# Patient Record
Sex: Male | Born: 1955 | Race: White | Hispanic: No
Health system: Southern US, Community
[De-identification: ages and names within clinical notes are randomized; demographics above are authoritative.]

## PROBLEM LIST (undated history)

## (undated) DIAGNOSIS — I219 Acute myocardial infarction, unspecified: Secondary | ICD-10-CM

## (undated) DIAGNOSIS — C801 Malignant (primary) neoplasm, unspecified: Secondary | ICD-10-CM

## (undated) HISTORY — PX: PELVIC FLOOR REPAIR: SHX2192

---

## 2014-10-16 HISTORY — PX: CARDIAC CATHETERIZATION: SHX172

## 2014-12-15 DIAGNOSIS — I219 Acute myocardial infarction, unspecified: Secondary | ICD-10-CM

## 2014-12-15 HISTORY — DX: Acute myocardial infarction, unspecified: I21.9

## 2018-06-05 ENCOUNTER — Emergency Department (HOSPITAL_COMMUNITY)
Admission: EM | Admit: 2018-06-05 | Discharge: 2018-06-05 | Disposition: A | Discharge status: Home / Self Care | Payer: BC Managed Care – PPO | Attending: Emergency Medicine | Admitting: Emergency Medicine

## 2018-06-05 ENCOUNTER — Other Ambulatory Visit: Payer: Self-pay

## 2018-06-05 ENCOUNTER — Emergency Department (HOSPITAL_COMMUNITY): Payer: BC Managed Care – PPO

## 2018-06-05 ENCOUNTER — Encounter (HOSPITAL_COMMUNITY): Payer: Self-pay | Admitting: Emergency Medicine

## 2018-06-05 DIAGNOSIS — X58XXXA Exposure to other specified factors, initial encounter: Secondary | ICD-10-CM | POA: Diagnosis not present

## 2018-06-05 DIAGNOSIS — Z7982 Long term (current) use of aspirin: Secondary | ICD-10-CM | POA: Insufficient documentation

## 2018-06-05 DIAGNOSIS — Y939 Activity, unspecified: Secondary | ICD-10-CM | POA: Diagnosis not present

## 2018-06-05 DIAGNOSIS — Y929 Unspecified place or not applicable: Secondary | ICD-10-CM | POA: Diagnosis not present

## 2018-06-05 DIAGNOSIS — S46812A Strain of other muscles, fascia and tendons at shoulder and upper arm level, left arm, initial encounter: Secondary | ICD-10-CM | POA: Insufficient documentation

## 2018-06-05 DIAGNOSIS — Z79899 Other long term (current) drug therapy: Secondary | ICD-10-CM | POA: Insufficient documentation

## 2018-06-05 DIAGNOSIS — Y999 Unspecified external cause status: Secondary | ICD-10-CM | POA: Diagnosis not present

## 2018-06-05 HISTORY — DX: Malignant (primary) neoplasm, unspecified: C80.1

## 2018-06-05 HISTORY — DX: Acute myocardial infarction, unspecified: I21.9

## 2018-06-05 LAB — I-STAT TROPONIN, ED
Troponin i, poc: 0 ng/mL (ref 0.00–0.08)
Troponin i, poc: 0 ng/mL (ref 0.00–0.08)

## 2018-06-05 LAB — CBC WITH DIFFERENTIAL/PLATELET
Abs Immature Granulocytes: 0.1 10*3/uL (ref 0.0–0.1)
Basophils Absolute: 0 10*3/uL (ref 0.0–0.1)
Basophils Relative: 1 %
Eosinophils Absolute: 0 10*3/uL (ref 0.0–0.7)
Eosinophils Relative: 0 %
HCT: 49.4 % (ref 39.0–52.0)
Hemoglobin: 16.4 g/dL (ref 13.0–17.0)
Immature Granulocytes: 1 %
Lymphocytes Relative: 6 %
Lymphs Abs: 0.4 10*3/uL — ABNORMAL LOW (ref 0.7–4.0)
MCH: 31.1 pg (ref 26.0–34.0)
MCHC: 33.2 g/dL (ref 30.0–36.0)
MCV: 93.6 fL (ref 78.0–100.0)
Monocytes Absolute: 0.4 10*3/uL (ref 0.1–1.0)
Monocytes Relative: 5 %
Neutro Abs: 6.1 10*3/uL (ref 1.7–7.7)
Neutrophils Relative %: 87 %
Platelets: 193 10*3/uL (ref 150–400)
RBC: 5.28 MIL/uL (ref 4.22–5.81)
RDW: 12.6 % (ref 11.5–15.5)
WBC: 7.1 10*3/uL (ref 4.0–10.5)

## 2018-06-05 LAB — COMPREHENSIVE METABOLIC PANEL
ALT: 33 U/L (ref 0–44)
AST: 21 U/L (ref 15–41)
Albumin: 4 g/dL (ref 3.5–5.0)
Alkaline Phosphatase: 76 U/L (ref 38–126)
Anion gap: 11 (ref 5–15)
BUN: 12 mg/dL (ref 6–20)
CO2: 25 mmol/L (ref 22–32)
Calcium: 9.7 mg/dL (ref 8.9–10.3)
Chloride: 102 mmol/L (ref 98–111)
Creatinine, Ser: 0.98 mg/dL (ref 0.61–1.24)
GFR calc Af Amer: >60 mL/min (ref >60)
GFR calc non Af Amer: >60 mL/min (ref >60)
Glucose, Bld: 114 mg/dL — ABNORMAL HIGH (ref 70–99)
Potassium: 4 mmol/L (ref 3.5–5.1)
Sodium: 138 mmol/L (ref 135–145)
Total Bilirubin: 1.1 mg/dL (ref 0.3–1.2)
Total Protein: 6.5 g/dL (ref 6.5–8.1)

## 2018-06-05 LAB — D-DIMER, QUANTITATIVE (NOT AT ARMC)

## 2018-06-05 LAB — BRAIN NATRIURETIC PEPTIDE: B NATRIURETIC PEPTIDE 5: 26 pg/mL (ref 0.0–100.0)

## 2018-06-05 MED ORDER — GI COCKTAIL ~~LOC~~
30.0000 mL | Freq: Once | ORAL | Status: AC
  Administered 2018-06-05: 30 mL via ORAL
  Filled 2018-06-05: qty 30

## 2018-06-05 NOTE — ED Triage Notes (Signed)
Patient to ED c/o intermittent L shoulderblade pain x 9 days, nothing makes it worse or better. He reports a couple episodes of palpitations and sweating since. He has a TENS unit and initially thought it was related to that. Hx MI March 2016, had cardiac cath and 3 stents placed. Hx prostate cancer as well, in remission since 2016. He reports he's also had muscle inflammation and his doctor put him on prednisone (currently taking). Denies SOB or dizziness. Took 1 SL NTG earlier today without relief. Resp e/u, skin warm/dry.

## 2018-06-05 NOTE — ED Provider Notes (Signed)
Patient placed in Quick Look pathway, seen and evaluated   Chief Complaint:   HPI:   Presents with tightness in the left upper back that started last week. Tried nitro which did not help. Used tens unit. Reports associated weakness, and fatigue, leg and abdominal swelling.  Today left sided chest pain. Hx of MI in 2016.   ROS: left shoulder blade pain, fatigue, abdominal fullness, lower leg swelling  Physical Exam:   Gen: No distress  Neuro: Awake and Alert  Skin: Warm    Focused Exam: regular hr and rhythm. Lungs clear to auscultation.   Initiation of care has begun. The patient has been counseled on the process, plan, and necessity for staying for the completion/evaluation, and the remainder of the medical screening examination  Pt with pain to the left shoulder blade. Most likely musculoskeletal given patient's prior cardiac history, will check labs and chest x-ray.  Patient otherwise in no acute distress.    Jeannett Senior, PA-C 06/05/18 2113    Deno Etienne, DO 06/05/18 2322

## 2018-06-05 NOTE — Discharge Instructions (Addendum)
Follow up with your PCP.  Return for worsening symptoms.  °

## 2018-06-05 NOTE — ED Provider Notes (Signed)
[facility] EMERGENCY DEPARTMENT Provider Note   CSN: [id number] Arrival date & time: 06/05/18  1521     History   Chief Complaint Chief Complaint  Patient presents with  . Shoulderblade Pain    HPI Jared Simmons is a 62 y.o. male.  62 yo M with a cc of left shoulder pain.  This been going on for the past week and a half.  He denies injury.  Worse with movement and palpation and improves with a TENS unit.  Seems to come and go.  Denies shortness of breath.  Patient has a history of a MI in the past that presented mostly with back pain and arm pain.  He is concerned that this may be the same.  Denies trauma denies fever denies hemoptysis denies unilateral lower extremity edema.  Patient has a history of prostate cancer and is on hormone therapy.  The history is provided by the patient.    Past Medical History:  Diagnosis Date  . Cancer [facility]) 2015-2016   prostate cancer - chemo  . Heart attack ([place]) 12/2014    There are no active problems to display for this patient.   Past Surgical History:  Procedure Laterality Date  . CARDIAC CATHETERIZATION  2016  . PELVIC FLOOR REPAIR          Home Medications    Prior to Admission medications   Medication Sig Start Date End Date Taking? Authorizing Provider  aspirin EC 81 MG tablet Take 81 mg by mouth daily.   Yes [provider]  cyanocobalamin (,VITAMIN B-12,) 1000 MCG/ML injection Inject 1,000 mcg into the muscle every 7 (seven) days. 05/07/18  Yes [provider]  cyclobenzaprine (FLEXERIL) 10 MG tablet Take 10 mg by mouth 3 (three) times daily as needed for muscle spasms.   Yes [provider]  hyoscyamine (LEVSIN SL) 0.125 MG SL tablet Place 0.125 mg under the tongue every 4 (four) hours as needed for cramping.   Yes [provider]  omeprazole (PRILOSEC) 20 MG capsule Take 20 mg by mouth daily.   Yes [provider]  oxyCODONE-acetaminophen  (PERCOCET/ROXICET) 5-325 MG tablet Take 1 tablet by mouth at bedtime as needed for severe pain.   Yes [provider]  predniSONE (DELTASONE) 10 MG tablet Take 10 mg by mouth 2 (two) times daily. 05/08/18  Yes [provider]  tamsulosin (FLOMAX) 0.4 MG CAPS capsule Take 0.4 mg by mouth daily after breakfast.    Yes [provider]  Vitamin D, Ergocalciferol, (DRISDOL) 50000 units CAPS capsule Take 50,000 Units by mouth every 7 (seven) days.   Yes [provider]    Family History No family history on file.  Social History Social History   Tobacco Use  . Smoking status: Not on file  . Smokeless tobacco: Never Used  Substance Use Topics  . Alcohol use: Yes    Comment: occ  . Drug use: Never     Allergies   Bee venom and Statins   Review of Systems Review of Systems  Constitutional: Negative for chills and fever.  HENT: Negative for congestion and facial swelling.   Eyes: Negative for discharge and visual disturbance.  Respiratory: Negative for shortness of breath.   Cardiovascular: Positive for palpitations. Negative for chest pain.  Gastrointestinal: Negative for abdominal pain, diarrhea and vomiting.  Musculoskeletal: Positive for back pain. Negative for arthralgias and myalgias.  Skin: Negative for color change and rash.  Neurological: Negative for tremors,  syncope and headaches.  Psychiatric/Behavioral: Negative for confusion and dysphoric mood.     Physical Exam Updated Vital Signs BP 124/78   Pulse 68   Temp 98.5 F (36.9 C) (Oral)   Resp 12   Ht 5' 11.5" (1.816 m)   Wt 115.2 kg   SpO2 100%   BMI 34.93 kg/m   Physical Exam  Constitutional: He is oriented to person, place, and time. He appears well-developed and well-nourished.  HENT:  Head: Normocephalic and atraumatic.  Eyes: Pupils are equal, round, and reactive to light. EOM are normal.  Neck: Normal range of motion. Neck supple. No JVD present.  Cardiovascular:  Normal rate and regular rhythm. Exam reveals no gallop and no friction rub.  No murmur heard. Pulmonary/Chest: No respiratory distress. He has no wheezes.  Abdominal: He exhibits no distension. There is no rebound and no guarding.  Musculoskeletal: Normal range of motion. He exhibits tenderness.  Tightness to the left trapezius.  Mildly tender.  No midline spinal tenderness.    Neurological: He is alert and oriented to person, place, and time.  Skin: No rash noted. No pallor.  Psychiatric: He has a normal mood and affect. His behavior is normal.  Nursing note and vitals reviewed.    ED Treatments / Results  Labs (all labs ordered are listed, but only abnormal results are displayed) Labs Reviewed  CBC WITH DIFFERENTIAL/PLATELET - Abnormal; Notable for the following components:      Result Value   Lymphs Abs 0.4 (*)    All other components within normal limits  COMPREHENSIVE METABOLIC PANEL - Abnormal; Notable for the following components:   Glucose, Bld 114 (*)    All other components within normal limits  BRAIN NATRIURETIC PEPTIDE  D-DIMER, QUANTITATIVE (NOT AT [facility])  I-STAT TROPONIN, ED  I-STAT TROPONIN, ED    EKG EKG Interpretation  Date/Time:  Wednesday June 05 2018 15:28:49 EDT Ventricular Rate:  103 PR Interval:  150 QRS Duration: 82 QT Interval:  334 QTC Calculation: 437 R Axis:   -34 Text Interpretation:  Sinus tachycardia Left axis deviation Possible Anterior infarct , age undetermined Abnormal ECG No old tracing to compare Confirmed by Deno Etienne [id number]) on 06/05/2018 8:01:57 PM   Radiology Dg Chest 2 View  Result Date: 06/05/2018 CLINICAL DATA:  Chest pain for 9 days, tachycardia on bloating, chest pressure, diaphoresis, swelling in legs, exhaustion, diarrhea, history MI, prostate cancer EXAM: CHEST - 2 VIEW COMPARISON:  None FINDINGS: Upper normal size of cardiac silhouette. Mediastinal contours and pulmonary vascularity normal. Lungs clear. No pleural  effusion or pneumothorax. No acute osseous findings. IMPRESSION: No acute abnormalities. Electronically Signed   By: Lavonia Dana M.D.   On: 06/05/2018 16:49    Procedures Procedures (including critical care time)  Medications Ordered in ED Medications  gi cocktail (Maalox,Lidocaine,Donnatal) (30 mLs Oral Given 06/05/18 2107)     Initial Impression / Assessment and Plan / ED Course  I have reviewed the triage vital signs and the nursing notes.  Pertinent labs & imaging results that were available during my care of the patient were reviewed by me and considered in my medical decision making (see chart for details).     62 yo M with a chief complaint of left trapezius pain.  Going on for the past week.  Improves with a TENS unit.  Seems to come and go.  Patient is concerned because he had a heart attack where he had mostly back pain and had some discomfort to  his arms.  Initial troponin is negative.  EKG is unremarkable.  Chest x-ray without focal infiltrate or pneumothorax.  My exam the patient has some tightness to the left trapezius.  I suspect that this is muscular in nature, he was tachycardic and is on therapy for prostate cancer therefore I will obtain a d-dimer.  Delta troponin is negative d-dimer is negative.  We will discharge the patient home.  PCP and cardiology follow-up.  11:22 PM:  I have discussed the diagnosis/risks/treatment options with the patient and believe the pt to be eligible for discharge home to follow-up with PCP, Cards. We also discussed returning to the ED immediately if new or worsening sx occur. We discussed the sx which are most concerning (e.g., sudden worsening pain, fever, inability to tolerate by mouth ) that necessitate immediate return. Medications administered to the patient during their visit and any new prescriptions provided to the patient are listed below.  Medications given during this visit Medications  gi cocktail (Maalox,Lidocaine,Donnatal) (30  mLs Oral Given 06/05/18 2107)     The patient appears reasonably screen and/or stabilized for discharge and I doubt any other medical condition or other [facility] requiring further screening, evaluation, or treatment in the ED at this time prior to discharge.    Final Clinical Impressions(s) / ED Diagnoses   Final diagnoses:  Trapezius strain, left, initial encounter    ED Discharge Orders    None       Deno Etienne, DO 06/05/18 2322

## 2019-06-16 ENCOUNTER — Other Ambulatory Visit: Payer: Self-pay

## 2019-06-16 DIAGNOSIS — Z20822 Contact with and (suspected) exposure to covid-19: Secondary | ICD-10-CM

## 2019-06-17 LAB — NOVEL CORONAVIRUS, NAA: SARS-CoV-2, NAA: NOT DETECTED

## 2019-07-07 ENCOUNTER — Other Ambulatory Visit: Payer: Self-pay

## 2019-07-07 DIAGNOSIS — Z20822 Contact with and (suspected) exposure to covid-19: Secondary | ICD-10-CM

## 2019-07-09 LAB — NOVEL CORONAVIRUS, NAA: SARS-CoV-2, NAA: NOT DETECTED

## 2019-10-20 ENCOUNTER — Other Ambulatory Visit: Payer: Managed Care, Other (non HMO)

## 2019-11-03 ENCOUNTER — Emergency Department (HOSPITAL_COMMUNITY): Payer: BC Managed Care – PPO

## 2019-11-03 ENCOUNTER — Emergency Department (HOSPITAL_COMMUNITY)
Admission: EM | Admit: 2019-11-03 | Discharge: 2019-11-04 | Disposition: A | Discharge status: Home / Self Care | Payer: BC Managed Care – PPO | Attending: Emergency Medicine | Admitting: Emergency Medicine

## 2019-11-03 ENCOUNTER — Other Ambulatory Visit: Payer: Self-pay

## 2019-11-03 DIAGNOSIS — R0789 Other chest pain: Secondary | ICD-10-CM | POA: Insufficient documentation

## 2019-11-03 DIAGNOSIS — Z20822 Contact with and (suspected) exposure to covid-19: Secondary | ICD-10-CM | POA: Diagnosis not present

## 2019-11-03 DIAGNOSIS — Z79899 Other long term (current) drug therapy: Secondary | ICD-10-CM | POA: Insufficient documentation

## 2019-11-03 DIAGNOSIS — Z7982 Long term (current) use of aspirin: Secondary | ICD-10-CM | POA: Insufficient documentation

## 2019-11-03 DIAGNOSIS — Z8546 Personal history of malignant neoplasm of prostate: Secondary | ICD-10-CM | POA: Diagnosis not present

## 2019-11-03 LAB — CBC
HCT: 48.4 % (ref 39.0–52.0)
Hemoglobin: 16 g/dL (ref 13.0–17.0)
MCH: 31.2 pg (ref 26.0–34.0)
MCHC: 33.1 g/dL (ref 30.0–36.0)
MCV: 94.3 fL (ref 80.0–100.0)
Platelets: 189 10*3/uL (ref 150–400)
RBC: 5.13 MIL/uL (ref 4.22–5.81)
RDW: 12.4 % (ref 11.5–15.5)
WBC: 8.5 10*3/uL (ref 4.0–10.5)
nRBC: 0 % (ref 0.0–0.2)

## 2019-11-03 NOTE — ED Triage Notes (Signed)
Pt c/o CP that began about 11 hours ago. States that he has taken 2 nitros and the pain continues and is now radiating to right scapula/axilla

## 2019-11-04 LAB — BASIC METABOLIC PANEL
Anion gap: 9 (ref 5–15)
BUN: 13 mg/dL (ref 8–23)
CO2: 26 mmol/L (ref 22–32)
Calcium: 9.4 mg/dL (ref 8.9–10.3)
Chloride: 103 mmol/L (ref 98–111)
Creatinine, Ser: 1.08 mg/dL (ref 0.61–1.24)
GFR calc Af Amer: >60 mL/min (ref >60)
GFR calc non Af Amer: >60 mL/min (ref >60)
Glucose, Bld: 154 mg/dL — ABNORMAL HIGH (ref 70–99)
Potassium: 3.6 mmol/L (ref 3.5–5.1)
Sodium: 138 mmol/L (ref 135–145)

## 2019-11-04 LAB — TROPONIN I (HIGH SENSITIVITY)
Troponin I (High Sensitivity): 6 ng/L (ref <18)
Troponin I (High Sensitivity): 7 ng/L (ref <18)

## 2019-11-04 MED ORDER — DICLOFENAC SODIUM 1 % EX GEL
4.0000 g | Freq: Once | CUTANEOUS | Status: AC
  Administered 2019-11-04: 4 g via TOPICAL
  Filled 2019-11-04: qty 100

## 2019-11-04 MED ORDER — ACETAMINOPHEN 500 MG PO TABS
1000.0000 mg | ORAL_TABLET | Freq: Once | ORAL | Status: AC
  Administered 2019-11-04: 1000 mg via ORAL
  Filled 2019-11-04: qty 2

## 2019-11-04 NOTE — Discharge Instructions (Signed)
Take tylenol for your pain.  Follow up with your doctor. Return for worsening symptoms.

## 2019-11-04 NOTE — ED Provider Notes (Signed)
[facility] EMERGENCY DEPARTMENT Provider Note   CSN: [id number] Arrival date & time: 11/03/19  2304     History Chief Complaint  Patient presents with  . Chest Pain    Jared Simmons is a 64 y.o. male.  64 yo M with a chief complaint of right-sided chest pain.  Started this morning.  Starts at the sternum and radiates to the right side.  Worse with sitting up moving palpation.  He tried some nitroglycerin at home without improvement.  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  Thinks that this feels different.  He has had some leg swelling that he thinks is due to his chronic prednisone use and Lupron.  History of prostate cancer.  No history of PE.   The history is provided by the patient.  Chest Pain Pain location:  R chest Pain quality: sharp and shooting   Pain radiates to:  Does not radiate Pain severity:  Moderate Onset quality:  Gradual Duration:  1 day Timing:  Constant Progression:  Worsening Chronicity:  New Relieved by:  Nothing Worsened by:  Certain positions Ineffective treatments:  None tried Associated symptoms: no abdominal pain, no fever, no headache, no palpitations, no shortness of breath and no vomiting        Past Medical History:  Diagnosis Date  . Cancer [facility]) 2015-2016   prostate cancer - chemo  . Heart attack ([place]) 12/2014    There are no problems to display for this patient.   Past Surgical History:  Procedure Laterality Date  . CARDIAC CATHETERIZATION  2016  . PELVIC FLOOR REPAIR         No family history on file.  Social History   Tobacco Use  . Smoking status: Not on file  . Smokeless tobacco: Never Used  Substance Use Topics  . Alcohol use: Yes    Comment: occ  . Drug use: Never    Home Medications Prior to Admission medications   Medication Sig Start Date End Date Taking? Authorizing Provider  aspirin EC 81 MG tablet Take 81 mg by mouth daily.    [provider]  cyanocobalamin (,VITAMIN B-12,) 1000 MCG/ML injection Inject 1,000 mcg into the muscle every 7 (seven) days. 05/07/18   [provider]  cyclobenzaprine (FLEXERIL) 10 MG tablet Take 10 mg by mouth 3 (three) times daily as needed for muscle spasms.    [provider]  hyoscyamine (LEVSIN SL) 0.125 MG SL tablet Place 0.125 mg under the tongue every 4 (four) hours as needed for cramping.    [provider]  omeprazole (PRILOSEC) 20 MG capsule Take 20 mg by mouth daily.    [provider]  oxyCODONE-acetaminophen (PERCOCET/ROXICET) 5-325 MG tablet Take 1 tablet by mouth at bedtime as needed for severe pain.    [provider]  predniSONE (DELTASONE) 10 MG tablet Take 10 mg by mouth 2 (two) times daily. 05/08/18   [provider]  tamsulosin (FLOMAX) 0.4 MG CAPS capsule Take 0.4 mg by mouth daily after breakfast.     [provider]  Vitamin D, Ergocalciferol, (DRISDOL) 50000 units CAPS capsule Take 50,000 Units by mouth every 7 (seven) days.    [provider]    Allergies    Bee venom and Statins  Review of Systems   Review of Systems  Constitutional: Negative for chills and fever.  HENT: Negative for congestion and facial swelling.   Eyes: Negative for discharge  and visual disturbance.  Respiratory: Negative for shortness of breath.   Cardiovascular: Positive for chest pain. Negative for palpitations.  Gastrointestinal: Negative for abdominal pain, diarrhea and vomiting.  Musculoskeletal: Negative for arthralgias and myalgias.  Skin: Negative for color change and rash.  Neurological: Negative for tremors, syncope and headaches.  Psychiatric/Behavioral: Negative for confusion and dysphoric mood.    Physical Exam Updated Vital Signs BP (!) 143/90   Pulse 79   Temp 98.1 F (36.7 C) (Oral)   Resp 17   Ht 5' 11.5" (1.816 m)   Wt 115.2 kg   SpO2 96%   BMI 34.93 kg/m   Physical Exam Vitals and nursing note  reviewed.  Constitutional:      Appearance: He is well-developed.  HENT:     Head: Normocephalic and atraumatic.  Eyes:     Pupils: Pupils are equal, round, and reactive to light.  Neck:     Vascular: No JVD.  Cardiovascular:     Rate and Rhythm: Normal rate and regular rhythm.     Heart sounds: No murmur. No friction rub. No gallop.   Pulmonary:     Effort: No respiratory distress.     Breath sounds: No wheezing.  Chest:     Chest wall: Tenderness present.     Comments: Palpation of the right chest wall reproduces the patient's pain Abdominal:     General: There is no distension.     Tenderness: There is no guarding or rebound.  Musculoskeletal:        General: Normal range of motion.     Cervical back: Normal range of motion and neck supple.  Skin:    Coloration: Skin is not pale.     Findings: No rash.  Neurological:     Mental Status: He is alert and oriented to person, place, and time.  Psychiatric:        Behavior: Behavior normal.     ED Results / Procedures / Treatments   Labs (all labs ordered are listed, but only abnormal results are displayed) Labs Reviewed  BASIC METABOLIC PANEL - Abnormal; Notable for the following components:      Result Value   Glucose, Bld 154 (*)    All other components within normal limits  NOVEL CORONAVIRUS, NAA (HOSP ORDER, SEND-OUT TO REF LAB; TAT 18-24 HRS)  CBC  TROPONIN I (HIGH SENSITIVITY)  TROPONIN I (HIGH SENSITIVITY)    EKG EKG Interpretation  Date/Time:  Monday November 03 2019 23:18:09 EST Ventricular Rate:  99 PR Interval:  154 QRS Duration: 94 QT Interval:  340 QTC Calculation: 436 R Axis:   -47 Text Interpretation: Normal sinus rhythm Left axis deviation Anterior infarct , age undetermined Abnormal ECG No significant change since last tracing Confirmed by Deno Etienne [phone]) on 11/04/2019 3:20:14 AM   Radiology DG Chest 2 View  Result Date: 11/03/2019 CLINICAL DATA:  Chest pain EXAM: CHEST - 2 VIEW  COMPARISON:  06/05/2018 FINDINGS: Heart size is enlarged. Mild vascular congestion is noted. There is no pneumothorax. No large pleural effusion. The lung volumes are low. Bibasilar atelectasis versus scarring is noted. There is no acute osseous abnormality. There appears to be some mild stable pleural thickening along the left chest wall. IMPRESSION: 1. Low lung volumes with bibasilar atelectasis. 2. Cardiomegaly with mild vascular congestion. Electronically Signed   By: Constance Holster M.D.   On: 11/03/2019 23:36    Procedures Procedures (including critical care time)  Medications Ordered in ED Medications  acetaminophen (  TYLENOL) tablet 1,000 mg (1,000 mg Oral Given 11/04/19 0427)  diclofenac Sodium (VOLTAREN) 1 % topical gel 4 g (4 g Topical Given 11/04/19 0427)    ED Course  I have reviewed the triage vital signs and the nursing notes.  Pertinent labs & imaging results that were available during my care of the patient were reviewed by me and considered in my medical decision making (see chart for details).    MDM Rules/Calculators/A&P                      64 yo M with chief complaint chest pain.  Is atypical in nature and reproduced on exam.  He has 2 troponins that are negative.  EKG without changes.  Chest x-ray viewed by me without focal trait or pneumothorax.  The patient is concerned about the possibility of a pulmonary embolism.  I discussed with him that this is an atypical way to present, even though he does have some pleuritic pain seems more muscular in nature.  I offered to perform a D-dimer though I told him my hesitancy, and his history of prostate cancer would likely be elevated.  I offered to obtain a CT angiogram of the chest which she is declining at this time.  We will have him follow-up with his family doctor.  4:51 AM:  I have discussed the diagnosis/risks/treatment options with the patient and believe the pt to be eligible for discharge home to follow-up with PCP. We  also discussed returning to the ED immediately if new or worsening sx occur. We discussed the sx which are most concerning (e.g., sudden worsening pain, fever, inability to tolerate by mouth) that necessitate immediate return. Medications administered to the patient during their visit and any new prescriptions provided to the patient are listed below.  Medications given during this visit Medications  acetaminophen (TYLENOL) tablet 1,000 mg (1,000 mg Oral Given 11/04/19 0427)  diclofenac Sodium (VOLTAREN) 1 % topical gel 4 g (4 g Topical Given 11/04/19 0427)     The patient appears reasonably screen and/or stabilized for discharge and I doubt any other medical condition or other [facility] requiring further screening, evaluation, or treatment in the ED at this time prior to discharge.   Final Clinical Impression(s) / ED Diagnoses Final diagnoses:  Chest wall pain    Rx / DC Orders ED Discharge Orders    None       Deno Etienne, DO 11/04/19 0451

## 2019-11-04 NOTE — ED Notes (Signed)
ED Provider at bedside. 

## 2019-11-05 LAB — NOVEL CORONAVIRUS, NAA (HOSP ORDER, SEND-OUT TO REF LAB; TAT 18-24 HRS): SARS-CoV-2, NAA: NOT DETECTED

## 2019-12-08 IMAGING — DX DG CHEST 2V
2 series · 2 of 2 positions shown · non-contrast
Comparison: None

CLINICAL DATA: Chest pain for 9 days, tachycardia on bloating,
chest pressure, diaphoresis, swelling in legs, exhaustion, diarrhea,
history MI, prostate cancer

EXAM:
CHEST - 2 VIEW

[chest pa]
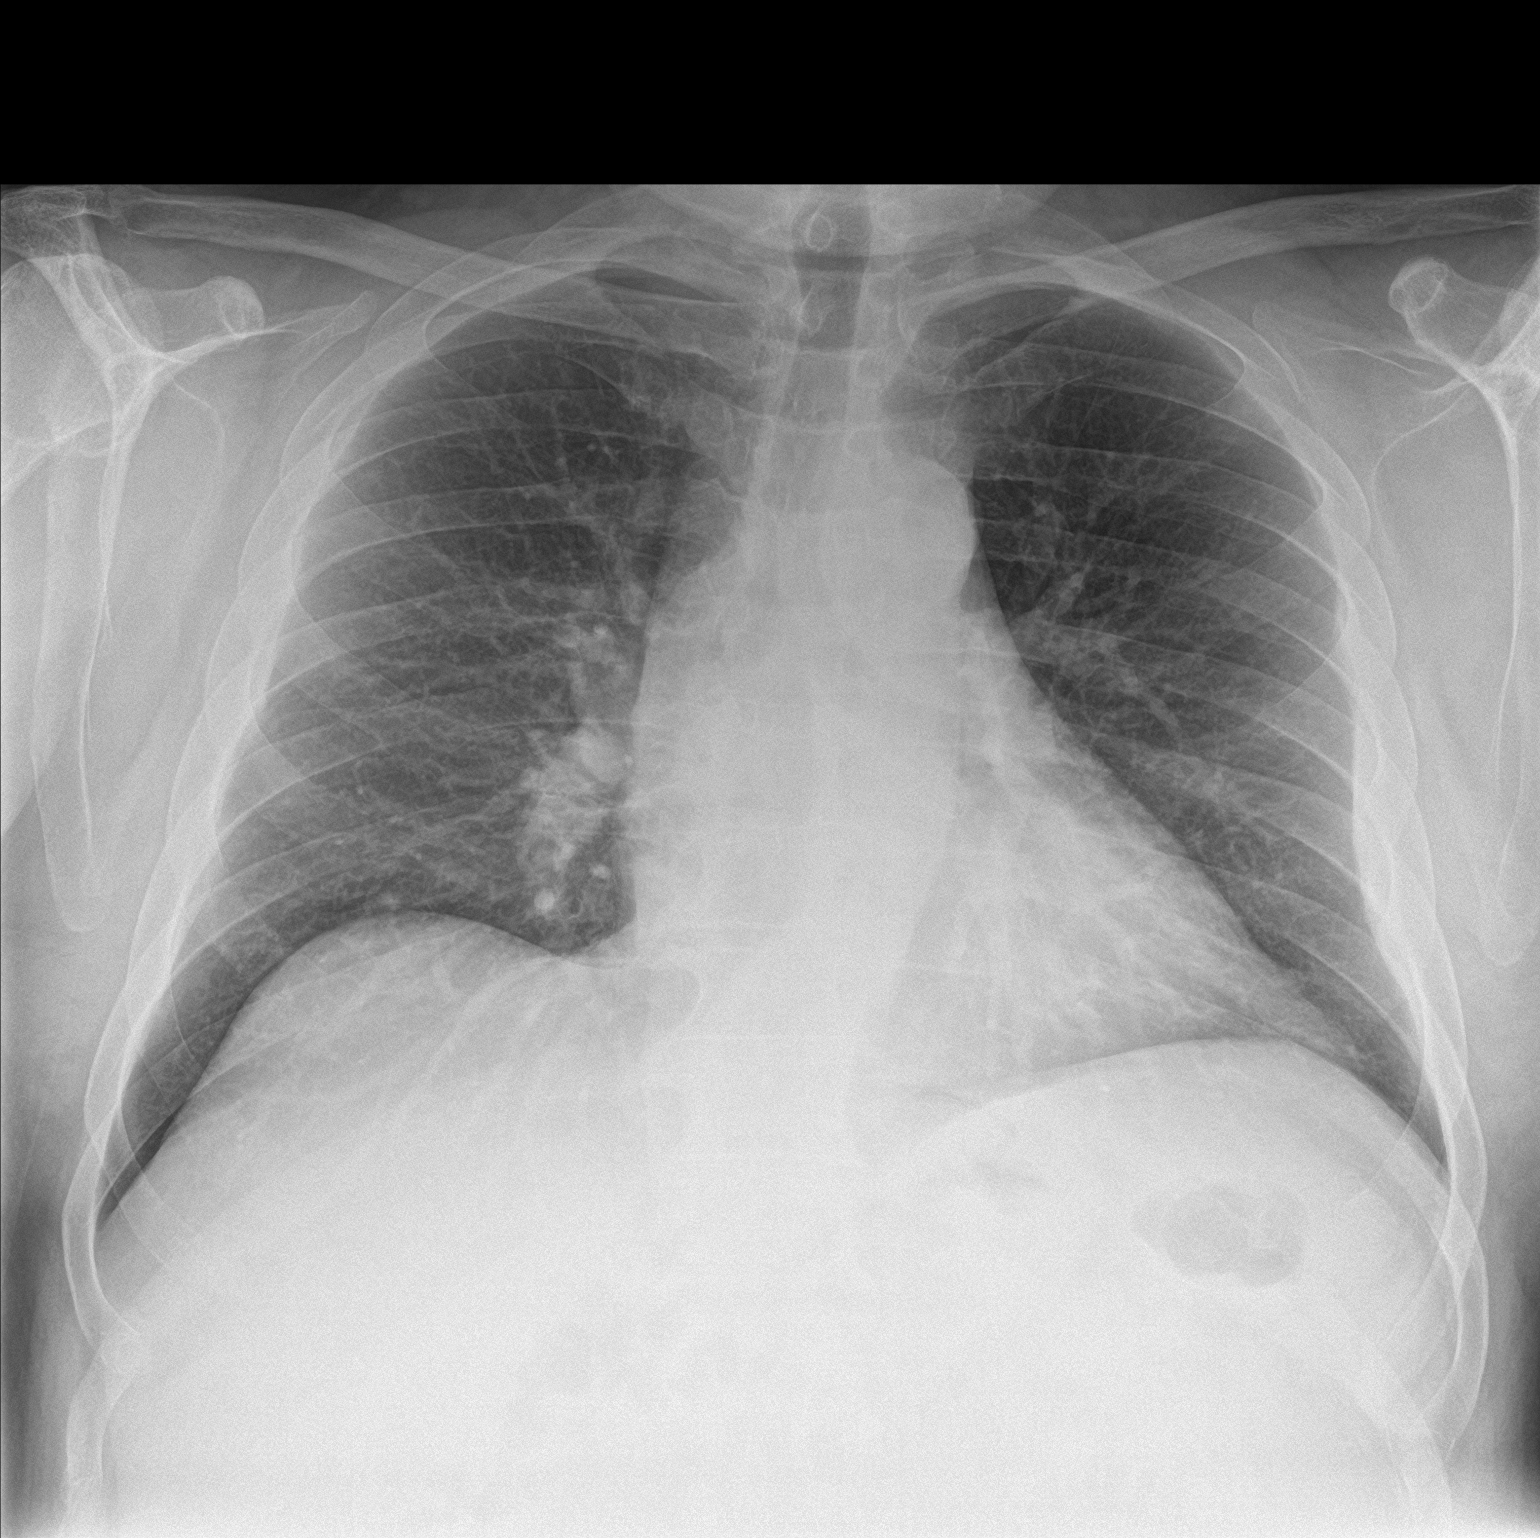

[chest lat]
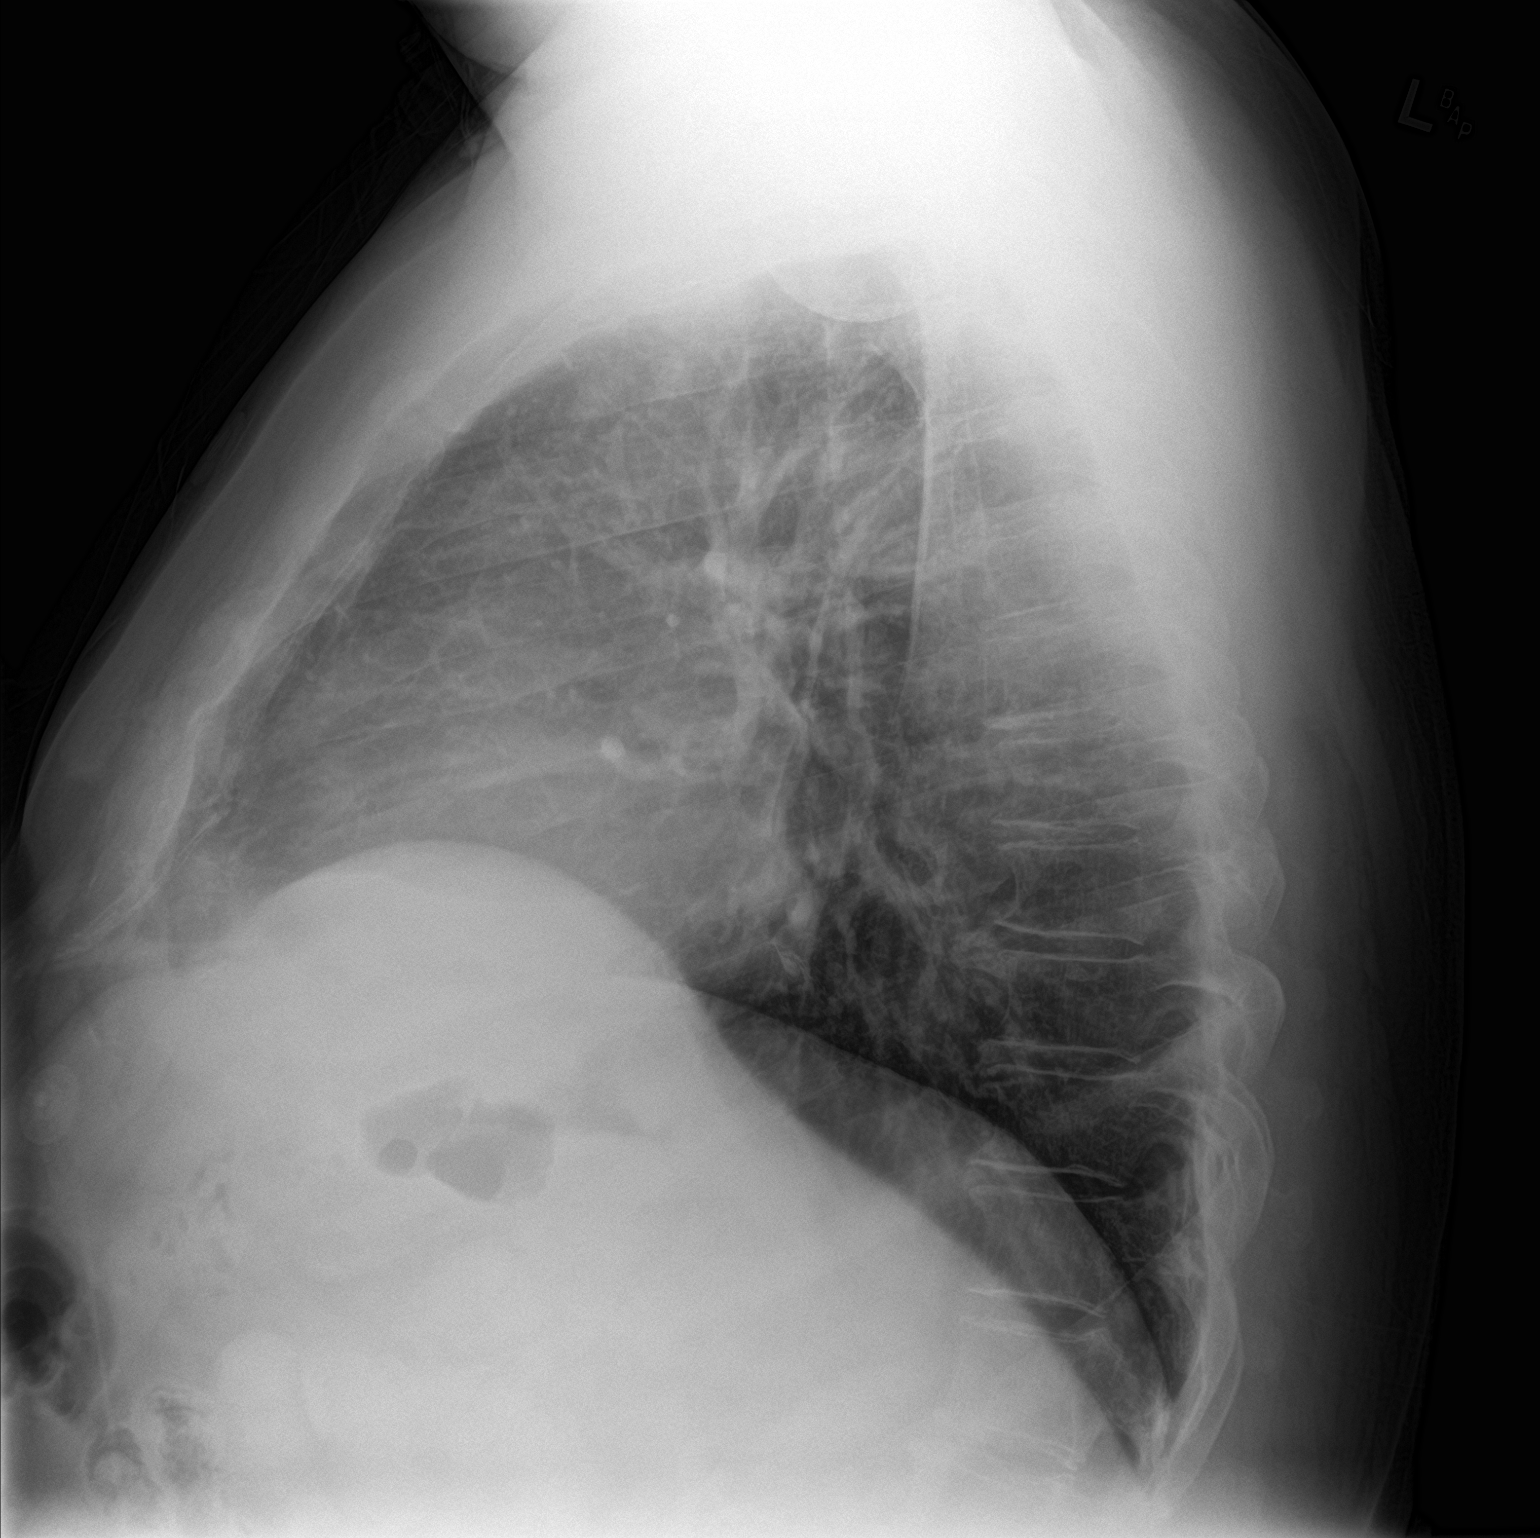

[2 of 2 positions shown; findings below may reference images not displayed]

FINDINGS: Upper normal size of cardiac silhouette.

Mediastinal contours and pulmonary vascularity normal.

Lungs clear.

No pleural effusion or pneumothorax.

No acute osseous findings.
IMPRESSION: No acute abnormalities.

## 2020-01-01 ENCOUNTER — Ambulatory Visit: Payer: Managed Care, Other (non HMO) | Attending: Family

## 2020-01-01 DIAGNOSIS — Z23 Encounter for immunization: Secondary | ICD-10-CM

## 2020-01-01 NOTE — Progress Notes (Signed)
   Covid-19 Vaccination Clinic  Name:  Jared Simmons    MRN: [id number] DOB: 1956-10-01  01/01/2020  [name] was observed post Covid-19 immunization for 15 minutes without incident. He was provided with Vaccine Information Sheet and instruction to access the V-Safe system.   [name] was instructed to call 911 with any severe reactions post vaccine: Marland Kitchen Difficulty breathing  . Swelling of face and throat  . A fast heartbeat  . A bad rash all over body  . Dizziness and weakness   Immunizations Administered    Name Date Dose VIS Date Route   Moderna COVID-19 Vaccine 01/01/2020  3:00 PM 0.5 mL 09/16/2019 Intramuscular   Manufacturer: Moderna   Lot: [id number]   [id number]

## 2020-02-03 ENCOUNTER — Ambulatory Visit: Payer: Managed Care, Other (non HMO) | Attending: Family

## 2020-02-03 DIAGNOSIS — Z23 Encounter for immunization: Secondary | ICD-10-CM

## 2020-02-03 NOTE — Progress Notes (Signed)
   Covid-19 Vaccination Clinic  Name:  KAUSHIK FEENEY    MRN: [id number] DOB: 1956/02/17  02/03/2020  Mr. Bernie was observed post Covid-19 immunization for 15 minutes without incident. He was provided with Vaccine Information Sheet and instruction to access the V-Safe system.   Mr. Klouda was instructed to call 911 with any severe reactions post vaccine: Marland Kitchen Difficulty breathing  . Swelling of face and throat  . A fast heartbeat  . A bad rash all over body  . Dizziness and weakness   Immunizations Administered    Name Date Dose VIS Date Route   Moderna COVID-19 Vaccine 02/03/2020 12:32 PM 0.5 mL 09/2019 Intramuscular   Manufacturer: Moderna   Lot: [id number]   [id number]

## 2020-08-03 ENCOUNTER — Emergency Department (HOSPITAL_COMMUNITY)
Admission: EM | Admit: 2020-08-03 | Discharge: 2020-08-04 | Disposition: A | Discharge status: Home / Self Care | Payer: BC Managed Care – PPO | Attending: Emergency Medicine | Admitting: Emergency Medicine

## 2020-08-03 ENCOUNTER — Encounter (HOSPITAL_COMMUNITY): Payer: Self-pay | Admitting: Emergency Medicine

## 2020-08-03 ENCOUNTER — Other Ambulatory Visit: Payer: Self-pay

## 2020-08-03 DIAGNOSIS — N200 Calculus of kidney: Secondary | ICD-10-CM | POA: Insufficient documentation

## 2020-08-03 DIAGNOSIS — R109 Unspecified abdominal pain: Secondary | ICD-10-CM | POA: Diagnosis present

## 2020-08-03 DIAGNOSIS — Z5321 Procedure and treatment not carried out due to patient leaving prior to being seen by health care provider: Secondary | ICD-10-CM | POA: Insufficient documentation

## 2020-08-03 LAB — URINALYSIS, ROUTINE W REFLEX MICROSCOPIC
Bilirubin Urine: NEGATIVE
Glucose, UA: NEGATIVE mg/dL
Hgb urine dipstick: NEGATIVE
Ketones, ur: NEGATIVE mg/dL
Leukocytes,Ua: NEGATIVE
Nitrite: NEGATIVE
Protein, ur: NEGATIVE mg/dL
Specific Gravity, Urine: 1.016 (ref 1.005–1.030)
pH: 6 (ref 5.0–8.0)

## 2020-08-03 NOTE — ED Triage Notes (Signed)
Pt reports 2 kidney stones, had CT yesterday, indicated 92mm Right and 74mm left stone, nonobstructing.  Pt states he cannot control the pain w/ medication, has been unable to keep anything down for two days due to the pain and now feels dehydrated.

## 2020-08-03 NOTE — ED Notes (Signed)
Patient states if he cant see a doctor immediately he would leave d/t being in extreme pain without any relief. LWBS

## 2020-09-07 ENCOUNTER — Ambulatory Visit: Payer: Managed Care, Other (non HMO) | Attending: Internal Medicine

## 2020-09-07 DIAGNOSIS — Z23 Encounter for immunization: Secondary | ICD-10-CM

## 2020-09-07 NOTE — Progress Notes (Signed)
   Covid-19 Vaccination Clinic  Name:  Jared Simmons    MRN: [id number] DOB: 01-12-1956  09/07/2020  [name] was observed post Covid-19 immunization for 30 minutes based on pre-vaccination screening without incident. He was provided with Vaccine Information Sheet and instruction to access the V-Safe system.   [name] was instructed to call 911 with any severe reactions post vaccine: Marland Kitchen Difficulty breathing  . Swelling of face and throat  . A fast heartbeat  . A bad rash all over body  . Dizziness and weakness   Immunizations Administered    No immunizations on file.

## 2021-04-01 ENCOUNTER — Ambulatory Visit: Payer: Managed Care, Other (non HMO)

## 2021-04-01 ENCOUNTER — Ambulatory Visit: Payer: Managed Care, Other (non HMO) | Attending: Family

## 2021-04-01 ENCOUNTER — Other Ambulatory Visit: Payer: Self-pay

## 2021-04-01 DIAGNOSIS — Z23 Encounter for immunization: Secondary | ICD-10-CM

## 2021-04-01 NOTE — Progress Notes (Signed)
   Covid-19 Vaccination Clinic  Name:  Jared Simmons    MRN: [id number] DOB: 1956/09/04  04/01/2021  [name] was observed post Covid-19 immunization for 15 minutes without incident. He was provided with Vaccine Information Sheet and instruction to access the V-Safe system.   [name] was instructed to call 911 with any severe reactions post vaccine: Difficulty breathing  Swelling of face and throat  A fast heartbeat  A bad rash all over body  Dizziness and weakness   Immunizations Administered     Name Date Dose VIS Date Route   Moderna Covid-19 Booster Vaccine 04/01/2021  4:58 PM 0.25 mL 08/04/2020 Intramuscular   Manufacturer: Moderna   Lot: [id number]   [place]: [phone]

## 2021-05-07 IMAGING — CR DG CHEST 2V
2 series · 2 of 2 positions shown · non-contrast
Comparison: 06/05/2018

CLINICAL DATA: Chest pain

EXAM:
CHEST - 2 VIEW

[chest pa]
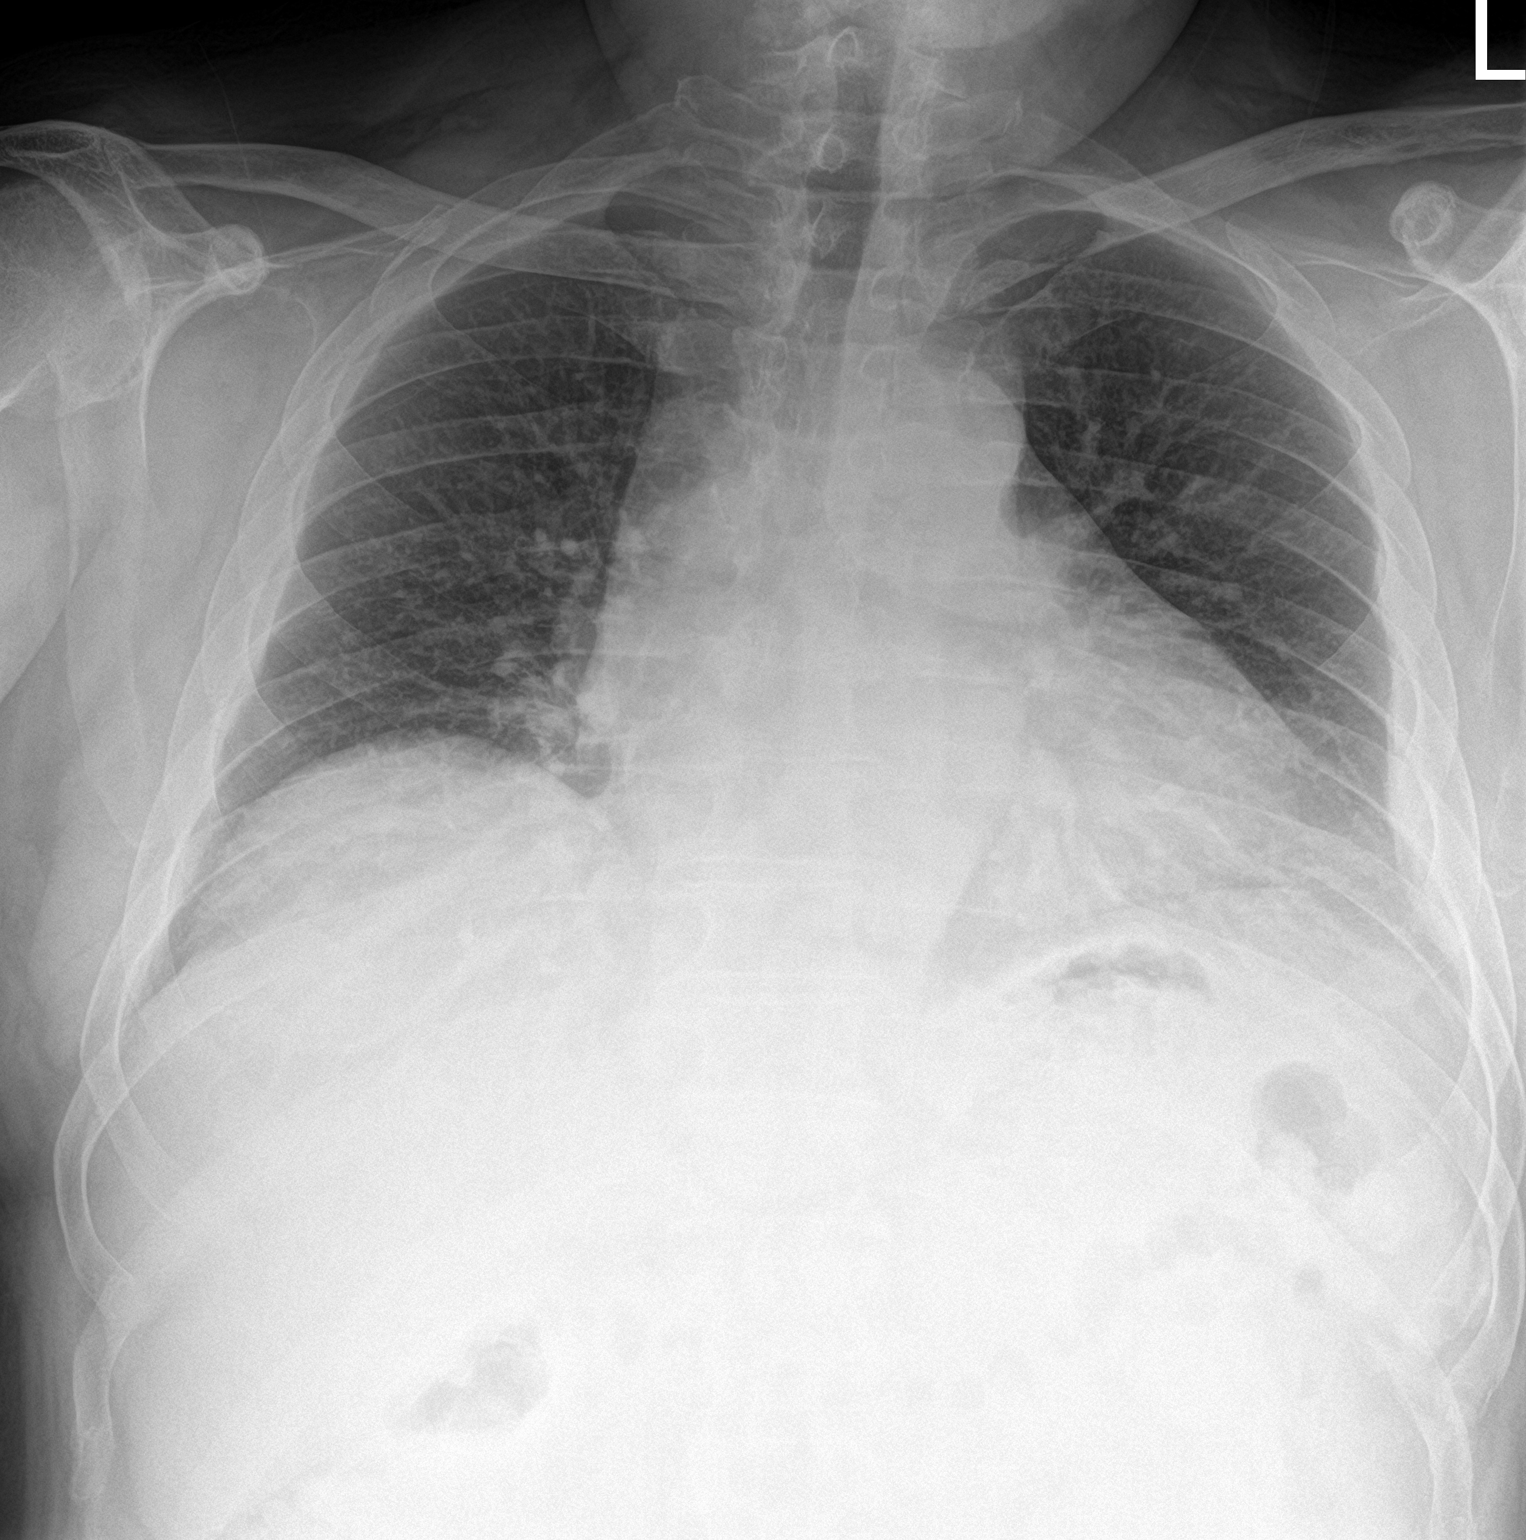

[chest lat]
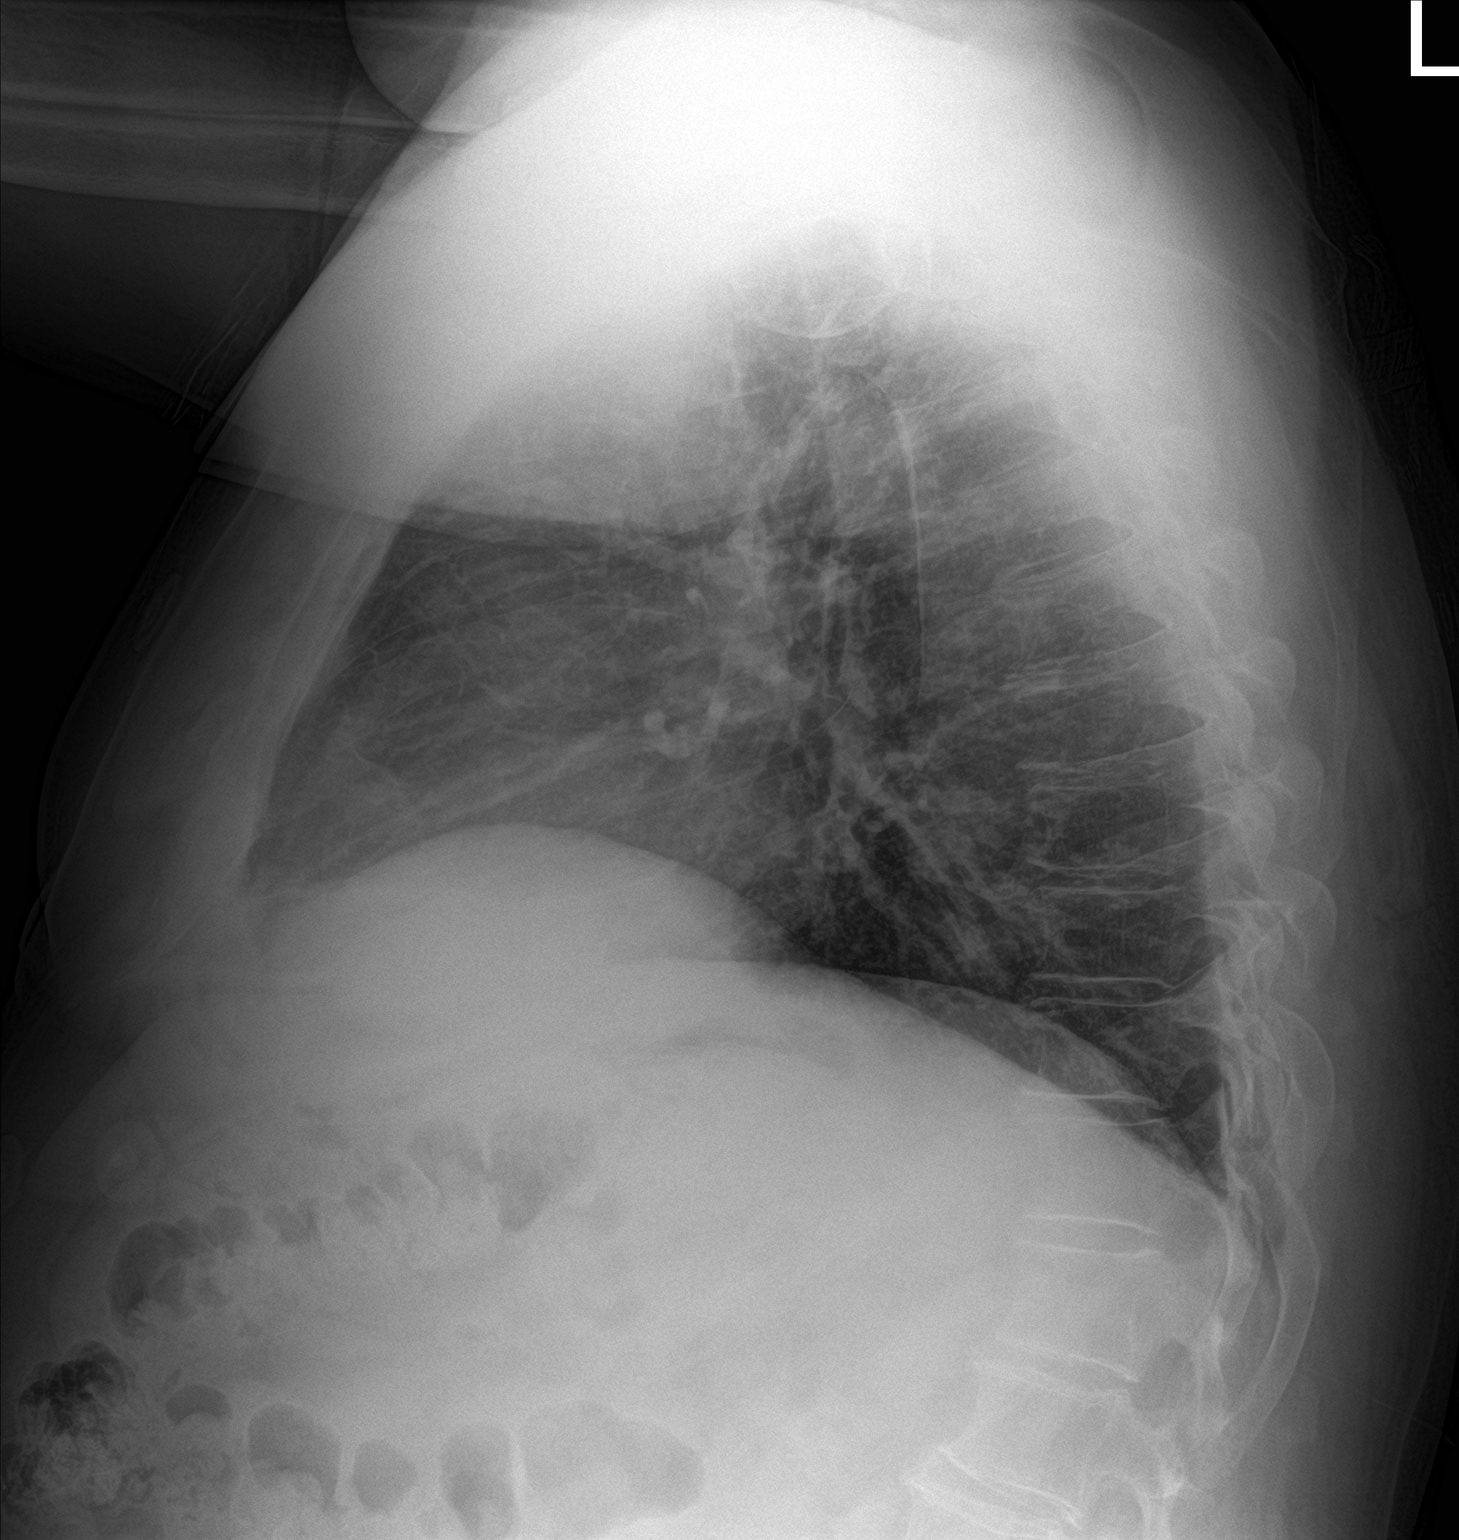

[2 of 2 positions shown; findings below may reference images not displayed]

FINDINGS: Heart size is enlarged. Mild vascular congestion is noted. There is
no pneumothorax. No large pleural effusion. The lung volumes are
low. Bibasilar atelectasis versus scarring is noted. There is no
acute osseous abnormality. There appears to be some mild stable
pleural thickening along the left chest wall.
IMPRESSION: 1. Low lung volumes with bibasilar atelectasis.
2. Cardiomegaly with mild vascular congestion.

## 2024-04-28 ENCOUNTER — Other Ambulatory Visit (HOSPITAL_COMMUNITY): Payer: Self-pay
